# Patient Record
Sex: Female | Born: 1965
Health system: Southern US, Community
[De-identification: ages and names within clinical notes are randomized; demographics above are authoritative.]

## PROBLEM LIST (undated history)

## (undated) DIAGNOSIS — R768 Other specified abnormal immunological findings in serum: Secondary | ICD-10-CM

## (undated) DIAGNOSIS — L700 Acne vulgaris: Secondary | ICD-10-CM

## (undated) DIAGNOSIS — N200 Calculus of kidney: Secondary | ICD-10-CM

## (undated) DIAGNOSIS — R000 Tachycardia, unspecified: Secondary | ICD-10-CM

## (undated) DIAGNOSIS — N879 Dysplasia of cervix uteri, unspecified: Secondary | ICD-10-CM

## (undated) DIAGNOSIS — R7989 Other specified abnormal findings of blood chemistry: Secondary | ICD-10-CM

## (undated) HISTORY — DX: Acne vulgaris: L70.0

## (undated) HISTORY — PX: BREAST BIOPSY: SHX20

## (undated) HISTORY — DX: Tachycardia, unspecified: R00.0

## (undated) HISTORY — PX: OTHER SURGICAL HISTORY: SHX169

## (undated) HISTORY — DX: Dysplasia of cervix uteri, unspecified: N87.9

## (undated) HISTORY — DX: Other specified abnormal immunological findings in serum: R76.8

## (undated) HISTORY — DX: Other specified abnormal findings of blood chemistry: R79.89

## (undated) HISTORY — PX: TONSILLECTOMY: SUR1361

## (undated) HISTORY — DX: Calculus of kidney: N20.0

---

## 1994-06-26 HISTORY — PX: OTHER SURGICAL HISTORY: SHX169

## 1998-06-26 HISTORY — PX: OTHER SURGICAL HISTORY: SHX169

## 2000-03-15 ENCOUNTER — Other Ambulatory Visit: Admission: RE | Admit: 2000-03-15 | Discharge: 2000-03-15 | Payer: Self-pay | Admitting: Family Medicine

## 2000-04-13 ENCOUNTER — Ambulatory Visit (HOSPITAL_COMMUNITY): Admission: RE | Admit: 2000-04-13 | Discharge: 2000-04-13 | Payer: Self-pay | Admitting: Obstetrics and Gynecology

## 2000-04-13 ENCOUNTER — Encounter (INDEPENDENT_AMBULATORY_CARE_PROVIDER_SITE_OTHER): Payer: Self-pay | Admitting: Specialist

## 2000-05-25 ENCOUNTER — Other Ambulatory Visit: Admission: RE | Admit: 2000-05-25 | Discharge: 2000-05-25 | Payer: Self-pay | Admitting: Obstetrics and Gynecology

## 2000-08-30 ENCOUNTER — Other Ambulatory Visit: Admission: RE | Admit: 2000-08-30 | Discharge: 2000-08-30 | Payer: Self-pay | Admitting: Obstetrics and Gynecology

## 2001-07-02 ENCOUNTER — Other Ambulatory Visit: Admission: RE | Admit: 2001-07-02 | Discharge: 2001-07-02 | Payer: Self-pay | Admitting: Obstetrics and Gynecology

## 2001-09-11 ENCOUNTER — Encounter: Payer: Self-pay | Admitting: Emergency Medicine

## 2001-09-11 ENCOUNTER — Emergency Department (HOSPITAL_COMMUNITY): Admission: EM | Admit: 2001-09-11 | Discharge: 2001-09-11 | Payer: Self-pay | Admitting: Emergency Medicine

## 2001-09-18 ENCOUNTER — Ambulatory Visit (HOSPITAL_COMMUNITY): Admission: RE | Admit: 2001-09-18 | Discharge: 2001-09-18 | Payer: Self-pay | Admitting: Neurology

## 2001-09-24 ENCOUNTER — Ambulatory Visit (HOSPITAL_COMMUNITY): Admission: RE | Admit: 2001-09-24 | Discharge: 2001-09-24 | Payer: Self-pay | Admitting: Neurology

## 2001-10-11 ENCOUNTER — Ambulatory Visit (HOSPITAL_BASED_OUTPATIENT_CLINIC_OR_DEPARTMENT_OTHER): Admission: RE | Admit: 2001-10-11 | Discharge: 2001-10-11 | Payer: Self-pay | Admitting: Neurology

## 2003-06-09 ENCOUNTER — Encounter: Admission: RE | Admit: 2003-06-09 | Discharge: 2003-06-09 | Payer: Self-pay | Admitting: Family Medicine

## 2004-07-26 ENCOUNTER — Ambulatory Visit: Payer: Self-pay | Admitting: Internal Medicine

## 2004-08-09 ENCOUNTER — Ambulatory Visit: Payer: Self-pay

## 2004-09-21 ENCOUNTER — Ambulatory Visit: Payer: Self-pay | Admitting: Internal Medicine

## 2005-03-23 ENCOUNTER — Other Ambulatory Visit: Admission: RE | Admit: 2005-03-23 | Discharge: 2005-03-23 | Payer: Self-pay | Admitting: Obstetrics and Gynecology

## 2005-08-16 ENCOUNTER — Other Ambulatory Visit: Admission: RE | Admit: 2005-08-16 | Discharge: 2005-08-16 | Payer: Self-pay | Admitting: Obstetrics & Gynecology

## 2006-03-08 ENCOUNTER — Other Ambulatory Visit: Admission: RE | Admit: 2006-03-08 | Discharge: 2006-03-08 | Payer: Self-pay | Admitting: Obstetrics & Gynecology

## 2007-03-21 ENCOUNTER — Other Ambulatory Visit: Admission: RE | Admit: 2007-03-21 | Discharge: 2007-03-21 | Payer: Self-pay | Admitting: Obstetrics and Gynecology

## 2008-04-14 ENCOUNTER — Other Ambulatory Visit: Admission: RE | Admit: 2008-04-14 | Discharge: 2008-04-14 | Payer: Self-pay | Admitting: Family Medicine

## 2009-04-15 ENCOUNTER — Other Ambulatory Visit: Admission: RE | Admit: 2009-04-15 | Discharge: 2009-04-15 | Payer: Self-pay | Admitting: Family Medicine

## 2010-11-11 NOTE — Op Note (Signed)
[facility]  Patient:    Hayley Price, Hayley Price                        MRN: [id number] Proc. Date: 04/13/00 Adm. Date:  [id number] Attending:  Malon Kindle CC:         Dr. Thomasene Mohair   Operative Report  PREOPERATIVE DIAGNOSIS:  Severe cervical dysplasia by colposcopic-directed biopsies with negative endocervical curettage, low-grade squamous intraepithelial lesion by Papanicolaou smear.  POSTOPERATIVE DIAGNOSIS:  Severe cervical dysplasia by colposcopic-directed biopsies with negative endocervical curettage, low-grade squamous intraepithelial lesion by Papanicolaou smear.  OPERATION:  CO2 laser conization, dilatation and curettage.  SURGEON:  Malachi Pro. Ambrose Mantle, M.D.  ANESTHESIA:  General.  DESCRIPTION OF PROCEDURE:  The patient was brought to the operating room and placed under satisfactory general anesthesia.  Exam revealed the uterus to be posterior, normal size.  The adnexa were free of masses.  The cervix was prepped with 5% acetic acid, and I could see white epithelium on the anterior and posterior cervical lips at the squamocolumnar junction.  After prepping with the 5% acetic acid, I did outline a conization with the laser, and I injected the cervix with a dilute solution of Pitressin, using about 15 cc of a solution of 10 units of Pitressin and 120 cc of saline.  I then did the laser cone with the CO2 laser Superpulse at the maximum wattage of 13 watts. After I cut the top of the cone off, I cut the cervical specimen as 12 oclock.  There was no significant bleeding.  I then dilated the cervical canal, sounded the uterus, and did an endocervical followed by an endometrial curettage, producing minimal tissue from both sites.  All the tissue that was removed was preserved.  The cervix was then reapproximated with four sutures of 0 chromic catgut at 12, 6, 3, and 9 oclock.  The cervical canal was again sounded to ensure patency, and a  little bit of Monsels solution was placed in the cervical bed.  The patient seemed to tolerate the procedure well.  Blood loss was less than 5 cc.  She was returned to recovery in satisfactory condition. DD:  04/13/00 TD:  04/13/00 Job: [id number]

## 2011-06-15 ENCOUNTER — Other Ambulatory Visit: Payer: Self-pay | Admitting: Family Medicine

## 2011-06-15 ENCOUNTER — Other Ambulatory Visit (HOSPITAL_COMMUNITY)
Admission: RE | Admit: 2011-06-15 | Discharge: 2011-06-15 | Disposition: A | Discharge status: Home / Self Care | Payer: No Typology Code available for payment source | Source: Ambulatory Visit | Attending: Family Medicine | Admitting: Family Medicine

## 2011-06-15 DIAGNOSIS — Z124 Encounter for screening for malignant neoplasm of cervix: Secondary | ICD-10-CM | POA: Insufficient documentation

## 2012-03-04 ENCOUNTER — Ambulatory Visit
Admission: RE | Admit: 2012-03-04 | Discharge: 2012-03-04 | Disposition: A | Discharge status: Home / Self Care | Payer: BC Managed Care – PPO | Source: Ambulatory Visit | Attending: Orthopedic Surgery | Admitting: Orthopedic Surgery

## 2012-03-04 ENCOUNTER — Other Ambulatory Visit: Payer: Self-pay | Admitting: Orthopedic Surgery

## 2012-03-04 DIAGNOSIS — T1490XA Injury, unspecified, initial encounter: Secondary | ICD-10-CM

## 2012-12-13 ENCOUNTER — Other Ambulatory Visit: Payer: Self-pay

## 2012-12-13 DIAGNOSIS — Z1231 Encounter for screening mammogram for malignant neoplasm of breast: Secondary | ICD-10-CM

## 2013-01-16 ENCOUNTER — Ambulatory Visit
Admission: RE | Admit: 2013-01-16 | Discharge: 2013-01-16 | Disposition: A | Discharge status: Home / Self Care | Payer: BC Managed Care – PPO | Source: Ambulatory Visit

## 2013-01-16 ENCOUNTER — Other Ambulatory Visit (HOSPITAL_COMMUNITY)
Admission: RE | Admit: 2013-01-16 | Discharge: 2013-01-16 | Disposition: A | Discharge status: Home / Self Care | Payer: BC Managed Care – PPO | Source: Ambulatory Visit | Attending: Family Medicine | Admitting: Family Medicine

## 2013-01-16 ENCOUNTER — Other Ambulatory Visit: Payer: Self-pay | Admitting: Family Medicine

## 2013-01-16 DIAGNOSIS — Z1151 Encounter for screening for human papillomavirus (HPV): Secondary | ICD-10-CM | POA: Insufficient documentation

## 2013-01-16 DIAGNOSIS — Z124 Encounter for screening for malignant neoplasm of cervix: Secondary | ICD-10-CM | POA: Insufficient documentation

## 2013-01-16 DIAGNOSIS — Z1231 Encounter for screening mammogram for malignant neoplasm of breast: Secondary | ICD-10-CM

## 2013-01-16 DIAGNOSIS — Z113 Encounter for screening for infections with a predominantly sexual mode of transmission: Secondary | ICD-10-CM | POA: Insufficient documentation

## 2013-01-20 ENCOUNTER — Other Ambulatory Visit: Payer: Self-pay | Admitting: Family Medicine

## 2013-01-20 DIAGNOSIS — R928 Other abnormal and inconclusive findings on diagnostic imaging of breast: Secondary | ICD-10-CM

## 2013-02-06 ENCOUNTER — Ambulatory Visit
Admission: RE | Admit: 2013-02-06 | Discharge: 2013-02-06 | Disposition: A | Discharge status: Home / Self Care | Payer: BC Managed Care – PPO | Source: Ambulatory Visit | Attending: Family Medicine | Admitting: Family Medicine

## 2013-02-06 DIAGNOSIS — R928 Other abnormal and inconclusive findings on diagnostic imaging of breast: Secondary | ICD-10-CM

## 2014-05-08 ENCOUNTER — Other Ambulatory Visit: Payer: Self-pay | Admitting: Family Medicine

## 2014-05-08 ENCOUNTER — Ambulatory Visit
Admission: RE | Admit: 2014-05-08 | Discharge: 2014-05-08 | Disposition: A | Discharge status: Home / Self Care | Payer: BC Managed Care – PPO | Source: Ambulatory Visit

## 2014-05-08 ENCOUNTER — Other Ambulatory Visit: Payer: Self-pay

## 2014-05-08 ENCOUNTER — Other Ambulatory Visit (HOSPITAL_COMMUNITY)
Admission: RE | Admit: 2014-05-08 | Discharge: 2014-05-08 | Disposition: A | Discharge status: Home / Self Care | Payer: BC Managed Care – PPO | Source: Ambulatory Visit | Attending: Family Medicine | Admitting: Family Medicine

## 2014-05-08 DIAGNOSIS — Z113 Encounter for screening for infections with a predominantly sexual mode of transmission: Secondary | ICD-10-CM | POA: Diagnosis present

## 2014-05-08 DIAGNOSIS — Z01419 Encounter for gynecological examination (general) (routine) without abnormal findings: Secondary | ICD-10-CM | POA: Diagnosis present

## 2014-05-08 DIAGNOSIS — Z1231 Encounter for screening mammogram for malignant neoplasm of breast: Secondary | ICD-10-CM

## 2014-05-08 DIAGNOSIS — N760 Acute vaginitis: Secondary | ICD-10-CM | POA: Diagnosis present

## 2014-05-08 DIAGNOSIS — Z124 Encounter for screening for malignant neoplasm of cervix: Secondary | ICD-10-CM | POA: Diagnosis not present

## 2014-05-12 LAB — CYTOLOGY - PAP

## 2015-04-27 ENCOUNTER — Other Ambulatory Visit: Payer: Self-pay

## 2015-04-27 DIAGNOSIS — Z1231 Encounter for screening mammogram for malignant neoplasm of breast: Secondary | ICD-10-CM

## 2015-04-28 ENCOUNTER — Other Ambulatory Visit: Payer: Self-pay | Admitting: Family Medicine

## 2015-04-28 ENCOUNTER — Other Ambulatory Visit (HOSPITAL_COMMUNITY)
Admission: RE | Admit: 2015-04-28 | Discharge: 2015-04-28 | Disposition: A | Discharge status: Home / Self Care | Payer: Managed Care, Other (non HMO) | Source: Ambulatory Visit | Attending: Family Medicine | Admitting: Family Medicine

## 2015-04-28 DIAGNOSIS — Z124 Encounter for screening for malignant neoplasm of cervix: Secondary | ICD-10-CM | POA: Diagnosis present

## 2015-04-28 DIAGNOSIS — Z1151 Encounter for screening for human papillomavirus (HPV): Secondary | ICD-10-CM | POA: Insufficient documentation

## 2015-04-30 LAB — CYTOLOGY - PAP

## 2015-06-02 ENCOUNTER — Ambulatory Visit
Admission: RE | Admit: 2015-06-02 | Discharge: 2015-06-02 | Disposition: A | Discharge status: Home / Self Care | Payer: Managed Care, Other (non HMO) | Source: Ambulatory Visit

## 2015-06-02 DIAGNOSIS — Z1231 Encounter for screening mammogram for malignant neoplasm of breast: Secondary | ICD-10-CM

## 2016-10-11 ENCOUNTER — Other Ambulatory Visit (HOSPITAL_COMMUNITY)
Admission: RE | Admit: 2016-10-11 | Discharge: 2016-10-11 | Disposition: A | Discharge status: Home / Self Care | Payer: BLUE CROSS/BLUE SHIELD | Source: Ambulatory Visit | Attending: Family Medicine | Admitting: Family Medicine

## 2016-10-11 ENCOUNTER — Other Ambulatory Visit: Payer: Self-pay | Admitting: Family Medicine

## 2016-10-11 DIAGNOSIS — Z124 Encounter for screening for malignant neoplasm of cervix: Secondary | ICD-10-CM | POA: Diagnosis present

## 2016-10-11 DIAGNOSIS — Z1151 Encounter for screening for human papillomavirus (HPV): Secondary | ICD-10-CM | POA: Insufficient documentation

## 2016-10-12 LAB — CYTOLOGY - PAP
Diagnosis: NEGATIVE
HPV: NOT DETECTED

## 2017-06-26 HISTORY — PX: BREAST BIOPSY: SHX20

## 2017-12-26 ENCOUNTER — Other Ambulatory Visit: Payer: Self-pay | Admitting: Family Medicine

## 2017-12-26 ENCOUNTER — Other Ambulatory Visit (HOSPITAL_COMMUNITY)
Admission: RE | Admit: 2017-12-26 | Discharge: 2017-12-26 | Disposition: A | Discharge status: Home / Self Care | Payer: BLUE CROSS/BLUE SHIELD | Source: Ambulatory Visit | Attending: Family Medicine | Admitting: Family Medicine

## 2017-12-26 DIAGNOSIS — Z124 Encounter for screening for malignant neoplasm of cervix: Secondary | ICD-10-CM | POA: Insufficient documentation

## 2017-12-26 DIAGNOSIS — Z1231 Encounter for screening mammogram for malignant neoplasm of breast: Secondary | ICD-10-CM

## 2017-12-31 LAB — CYTOLOGY - PAP
Adequacy: ABSENT
Diagnosis: NEGATIVE
HPV: NOT DETECTED

## 2018-01-21 ENCOUNTER — Ambulatory Visit
Admission: RE | Admit: 2018-01-21 | Discharge: 2018-01-21 | Disposition: A | Discharge status: Home / Self Care | Payer: Self-pay | Source: Ambulatory Visit | Attending: Family Medicine | Admitting: Family Medicine

## 2018-01-21 DIAGNOSIS — Z1231 Encounter for screening mammogram for malignant neoplasm of breast: Secondary | ICD-10-CM

## 2018-01-22 ENCOUNTER — Other Ambulatory Visit: Payer: Self-pay | Admitting: Family Medicine

## 2018-01-22 DIAGNOSIS — R921 Mammographic calcification found on diagnostic imaging of breast: Secondary | ICD-10-CM

## 2018-01-29 ENCOUNTER — Other Ambulatory Visit: Payer: Self-pay | Admitting: Family Medicine

## 2018-01-29 ENCOUNTER — Ambulatory Visit
Admission: RE | Admit: 2018-01-29 | Discharge: 2018-01-29 | Disposition: A | Discharge status: Home / Self Care | Payer: BLUE CROSS/BLUE SHIELD | Source: Ambulatory Visit | Attending: Family Medicine | Admitting: Family Medicine

## 2018-01-29 DIAGNOSIS — R921 Mammographic calcification found on diagnostic imaging of breast: Secondary | ICD-10-CM

## 2018-01-30 ENCOUNTER — Ambulatory Visit
Admission: RE | Admit: 2018-01-30 | Discharge: 2018-01-30 | Disposition: A | Discharge status: Home / Self Care | Payer: BLUE CROSS/BLUE SHIELD | Source: Ambulatory Visit | Attending: Family Medicine | Admitting: Family Medicine

## 2018-01-30 DIAGNOSIS — R921 Mammographic calcification found on diagnostic imaging of breast: Secondary | ICD-10-CM

## 2018-12-16 ENCOUNTER — Other Ambulatory Visit: Payer: Self-pay | Admitting: Family Medicine

## 2018-12-16 DIAGNOSIS — Z1231 Encounter for screening mammogram for malignant neoplasm of breast: Secondary | ICD-10-CM

## 2019-01-23 ENCOUNTER — Other Ambulatory Visit: Payer: Self-pay

## 2019-01-23 ENCOUNTER — Ambulatory Visit
Admission: RE | Admit: 2019-01-23 | Discharge: 2019-01-23 | Disposition: A | Discharge status: Home / Self Care | Payer: BC Managed Care – PPO | Source: Ambulatory Visit | Attending: Family Medicine | Admitting: Family Medicine

## 2019-01-23 DIAGNOSIS — Z1231 Encounter for screening mammogram for malignant neoplasm of breast: Secondary | ICD-10-CM

## 2020-01-20 ENCOUNTER — Other Ambulatory Visit: Payer: Self-pay | Admitting: Family Medicine

## 2020-01-20 DIAGNOSIS — Z1231 Encounter for screening mammogram for malignant neoplasm of breast: Secondary | ICD-10-CM

## 2020-01-29 ENCOUNTER — Other Ambulatory Visit: Payer: Self-pay

## 2020-01-29 ENCOUNTER — Ambulatory Visit
Admission: RE | Admit: 2020-01-29 | Discharge: 2020-01-29 | Disposition: A | Discharge status: Home / Self Care | Payer: BLUE CROSS/BLUE SHIELD | Source: Ambulatory Visit | Attending: Family Medicine | Admitting: Family Medicine

## 2020-01-29 DIAGNOSIS — Z1231 Encounter for screening mammogram for malignant neoplasm of breast: Secondary | ICD-10-CM

## 2020-04-19 NOTE — Progress Notes (Signed)
[facility]    Provider:  Dr Jaynee Eagles Requesting Provider: Marda Stalker, PA-C Primary Care Provider:  Marda Stalker, PA-C  CC:  Tingling in hands  HPI:  Hayley Price is a 54 y.o. female here as requested by Marda Stalker, PA-C for cardiac arrhythmia, sleep disorder, vitamin D deficiency, HSV.  I reviewed notes from Marda Stalker, ongoing tingling in the hands, patient had a reaction after second Covid shot, she had it in April, was very painful in her arm, 4 hours after receiving the Covid vaccine she began having numbness from her bilateral knees down to the foot, tingling is mostly lateral calf to the top of the foot, next day knee pain going downstairs, then developed widespread joint disease.  She finished her prednisone that [facility] prescribed, was getting better but after she finished the meds experience the same symptoms, felt like she had an overnight attack of arthritis, joint pain, 6 weeks post vaccine, they also tried meloxicam, and rheumatology was referred, she saw a rheumatologist who could not do anything and requested coming to neurology, however Marda Stalker stated that it did not appear to be nerve related, more swelling and joint aches, rheumatology did a full panel of labs for inflammatory arthritis or possible post viral, and the rheumatologist told her it was not rheumatology related.  Then patient stated that there was some numbness and they referred her to neurology.  She says "I am falling apart". Symptoms started 5-6 hour after the second shot. 6 months still having symptoms. She can't do a lot of things, she has been on 2 rounds of steroids and been off of them for 2 months. Sh states it started with numbness and tingling left leg lateral to the top of the foot. It is still numb but not as much. No back, no radiation into the lags. It has gotten better. No weakness. The next morning she went to walk down the stairs because she  couldn;t bend her knees, lots of joint pain and swelling followed in the knees and to the ankles and she still can;t bend her ankles, her feet and ankles make a sound, pain and stiffness. Difficulty bending her joints, still stiff. She improved with prednisone.She saw Rheumatology who said everything was normal and this is not a Rheumatology disorder. She has also had other symptoms, she had her period for 16 days and cramps (I encouraged her to talk to Marda Stalker about this). No weakness. No paresthesias in the hands.Feels losing fine motor in the hands, impaired coordination in both hands. Unable to use her hands.No balance issues, no falls.    Reviewed notes, labs and imaging from outside physicians, which showed:  Rheumatology tests([place] [facility]: RA, CCP, Sed Rate, CRPQ, CMP,CBCD,Lyme I don;t have results but patient states she was told its normal)   Labs included [facility] July 2019 which was normal with BUN 11 and creatinine 0.64.  CT head 2003: after vertigo and "seizure" IMPRESSION  NEGATIVE NONCONTRAST CRANIAL CT.  Review of Systems: Patient complains of symptoms per HPI as well as the following symptoms" numbness, tingling, weakness, joint pain. Pertinent negatives and positives per HPI. All others negative.   Social History   Socioeconomic History  . Marital status: Divorced    Spouse name: Not on file  . Number of children: 1  . Years of education: Not on file  . Highest education level: Bachelor's degree (e.g., BA, AB, BS)  Occupational History  . Not on file  Tobacco Use  .  Smoking status: Never Smoker  . Smokeless tobacco: Never Used  Vaping Use  . Vaping Use: Never used  Substance and Sexual Activity  . Alcohol use: Yes    Alcohol/week: 1.0 - 2.0 standard drink    Types: 1 - 2 Glasses of wine per week  . Drug use: Never  . Sexual activity: Not on file  Other Topics Concern  . Not on file  Social History Narrative   Lives at home alone   Caffeine: 2  cups coffee/day   Social Determinants of Health   Financial Resource Strain:   . Difficulty of Paying Living Expenses: Not on file  Food Insecurity:   . Worried About Charity fundraiser in the Last Year: Not on file  . Ran Out of Food in the Last Year: Not on file  Transportation Needs:   . Lack of Transportation (Medical): Not on file  . Lack of Transportation (Non-Medical): Not on file  Physical Activity:   . Days of Exercise per Week: Not on file  . Minutes of Exercise per Session: Not on file  Stress:   . Feeling of Stress : Not on file  Social Connections:   . Frequency of Communication with Friends and Family: Not on file  . Frequency of Social Gatherings with Friends and Family: Not on file  . Attends Religious Services: Not on file  . Active Member of Clubs or Organizations: Not on file  . Attends Archivist Meetings: Not on file  . Marital Status: Not on file  Intimate Partner Violence:   . Fear of Current or Ex-Partner: Not on file  . Emotionally Abused: Not on file  . Physically Abused: Not on file  . Sexually Abused: Not on file    Family History  Problem Relation Age of Onset  . Osteoporosis Mother   . Pancreatic cancer Father   . Healthy Brother   . Osteoporosis Maternal Grandmother   . Neuropathy Neg Hx     Past Medical History:  Diagnosis Date  . Cervical dysplasia    per notes from [facility]  . Cystic acne    per notes from [facility]  . HSV-2 seropositive    no confirmed active outbreaks; per notes from [facility]  . Low vitamin D level    per notes from [facility]  . Nephrolithiasis    per notes from [facility]  . Tachycardia    per notes from [place]    Patient Active Problem List   Diagnosis Date Noted  . Adverse reaction to COVID-19 vaccine 04/20/2020  . Polyarthralgia 04/20/2020  . Peroneal neuropathy, left 04/20/2020  . Multiple neurological symptoms 04/20/2020  . Numbness and  tingling in both hands 04/20/2020    Past Surgical History:  Procedure Laterality Date  . BREAST BIOPSY Left 2019  . BREAST BIOPSY Left    per notes from [facility]  . cervical conization  2000   per notes from [facility]; laser  . COLONOSCOPY     per notes from [facility] negative, rpt 10 years  . TONSILLECTOMY     per notes from [facility]  . vulvar reconstruction  1996   per notes from [place]    Current Outpatient Medications  Medication Sig Dispense Refill  . amitriptyline (ELAVIL) 10 MG tablet Take 10 mg by mouth at bedtime.    Marland Kitchen ascorbic acid (VITAMIN C) 500 MG tablet Take 1,000 mg by mouth daily.    Marland Kitchen  CALCIUM PO Take by mouth.    Marland Kitchen ibuprofen (ADVIL) 200 MG tablet Take 800 mg by mouth in the morning, at noon, and at bedtime.    Marland Kitchen MAGNESIUM CITRATE PO Take by mouth.    . Multiple Vitamin (MULTIVITAMIN PO) Take by mouth.    Marland Kitchen VITAMIN D PO Take by mouth.     No current facility-administered medications for this visit.    Allergies as of 04/20/2020 - Review Complete 04/20/2020  Allergen Reaction Noted  . Amitriptyline  04/20/2020  . Bactrim [sulfamethoxazole-trimethoprim]  04/20/2020  . Ciprofloxacin  04/20/2020  . Other  04/20/2020  . Poison ivy extract  04/20/2020  . Vioxx [rofecoxib]  04/20/2020  . Latex Rash 04/20/2020    Vitals: BP (!) 145/84 (BP Location: Right Arm, Patient Position: Sitting)   Pulse 93   Ht '5\' 4"'  (1.626 m)   Wt 148 lb (67.1 kg)   BMI 25.40 kg/m  Last Weight:  Wt Readings from Last 1 Encounters:  04/20/20 148 lb (67.1 kg)   Last Height:   Ht Readings from Last 1 Encounters:  04/20/20 '5\' 4"'  (1.626 m)     Physical exam: Exam: Gen: NAD, anxious and slightly pressured speech, conversant, well nourised, obese, well groomed                     CV: RRR, no MRG. No Carotid Bruits. No peripheral edema, warm, nontender Eyes: Conjunctivae clear without exudates or hemorrhage  Neuro: Detailed Neurologic  Exam  Speech:    Speech is normal; fluent and spontaneous with normal comprehension.  Cognition:    The patient is oriented to person, place, and time;     recent and remote memory intact;     language fluent;     normal attention, concentration,     fund of knowledge Cranial Nerves:    The pupils are equal, round, and reactive to light. The fundi are flat . Visual fields are full to finger confrontation. Extraocular movements are intact. Trigeminal sensation is intact and the muscles of mastication are normal. The face is symmetric. The palate elevates in the midline. Hearing intact. Voice is normal. Shoulder shrug is normal. The tongue has normal motion without fasciculations.   Coordination:    No dysmetria or ataxia noted(but patient reports these symptoms)  Gait:    Normal native gait  Motor Observation:    No asymmetry, no atrophy, and no involuntary movements noted. Tone:    Normal muscle tone.    Posture:    Posture is normal. normal erect    Strength: Weak grip. Strength is V/V in the upper and lower limbs.      Sensation: intact to LT     Reflex Exam:  DTR's:    Deep tendon reflexes in the upper and lower extremities are brisk bilaterally.   Toes:    The toes are downgoing bilaterally.   Clonus:    Clonus is absent.    Assessment/Plan:  Patient with with multiple neurologic symptoms, after Covid, mostly pain and stiffness in knees, ankles, joints of hands, elbows after covid shot. Difficulty bending her joints. She saw Rheumatology. She reports multiple symptoms including numbness/tingling in the left lower leg in a peroneal distribution and in the hands.  Improved with steroids so need to make sure she did not have any inflammatory/demyelinating events in the brain or cervical spine such as Multiple Sclerosis or Transverse myelitis due to her multiple complaints including fine motor decline in the  hands and exam with brisk reflexes which may be abnormal for her  age.   EMG/NCS: Bilateral uppers for CTS, Ulnar neuropathy or other. Also left leg peroneal sensory recording at the Tibialis Anterior (she had a crush injury to the left foot so recording at the EDB may be invalid due to prior injury).  Orders Placed This Encounter  Procedures  . MR BRAIN W WO CONTRAST  . MR CERVICAL SPINE W WO CONTRAST  . Vitamin B1  . Hemoglobin A1c  . Methylmalonic acid, serum  . TSH  . Sedimentation rate  . B12 and Folate Panel  . Heavy metals, blood  . Vitamin B6  . Multiple Myeloma Panel (SPEP&IFE w/QIG)  . Basic Metabolic Panel     Cc: Marda Stalker, PA-C,    Sarina Ill, MD  [facility] [address] [place], [place] [address]  Phone [phone] Fax [phone]

## 2020-04-20 ENCOUNTER — Ambulatory Visit (INDEPENDENT_AMBULATORY_CARE_PROVIDER_SITE_OTHER): Payer: BLUE CROSS/BLUE SHIELD | Admitting: Neurology

## 2020-04-20 ENCOUNTER — Encounter: Payer: Self-pay | Admitting: Neurology

## 2020-04-20 ENCOUNTER — Other Ambulatory Visit: Payer: Self-pay

## 2020-04-20 VITALS — BP 145/84 | HR 93 | Ht 64.0 in | Wt 148.0 lb

## 2020-04-20 DIAGNOSIS — M6281 Muscle weakness (generalized): Secondary | ICD-10-CM

## 2020-04-20 DIAGNOSIS — R29898 Other symptoms and signs involving the musculoskeletal system: Secondary | ICD-10-CM

## 2020-04-20 DIAGNOSIS — R292 Abnormal reflex: Secondary | ICD-10-CM

## 2020-04-20 DIAGNOSIS — M2550 Pain in unspecified joint: Secondary | ICD-10-CM | POA: Diagnosis not present

## 2020-04-20 DIAGNOSIS — R202 Paresthesia of skin: Secondary | ICD-10-CM

## 2020-04-20 DIAGNOSIS — G589 Mononeuropathy, unspecified: Secondary | ICD-10-CM

## 2020-04-20 DIAGNOSIS — R2990 Unspecified symptoms and signs involving the nervous system: Secondary | ICD-10-CM

## 2020-04-20 DIAGNOSIS — G5732 Lesion of lateral popliteal nerve, left lower limb: Secondary | ICD-10-CM | POA: Diagnosis not present

## 2020-04-20 DIAGNOSIS — R270 Ataxia, unspecified: Secondary | ICD-10-CM

## 2020-04-20 DIAGNOSIS — R200 Anesthesia of skin: Secondary | ICD-10-CM | POA: Diagnosis not present

## 2020-04-20 DIAGNOSIS — T50B95A Adverse effect of other viral vaccines, initial encounter: Secondary | ICD-10-CM | POA: Insufficient documentation

## 2020-04-20 DIAGNOSIS — R29818 Other symptoms and signs involving the nervous system: Secondary | ICD-10-CM

## 2020-04-20 NOTE — Patient Instructions (Signed)
MRI of the brain and cervical spine EMG/NCS Blood work   Marine scientist is a test to check how well your muscles and nerves are working. This procedure includes the combined use of electromyogram (EMG) and nerve conduction study (NCS). EMG is used to look for muscular disorders. NCS, which is also called electroneurogram, measures how well your nerves are controlling your muscles. The procedures are usually done together to check if your muscles and nerves are healthy. If the results of the tests are abnormal, this may indicate disease or injury, such as a neuromuscular disease or peripheral nerve damage. Tell a health care provider about:  Any allergies you have.  All medicines you are taking, including vitamins, herbs, eye drops, creams, and over-the-counter medicines.  Any problems you or family members have had with anesthetic medicines.  Any blood disorders you have.  Any surgeries you have had.  Any medical conditions you have.  If you have a pacemaker.  Whether you are pregnant or may be pregnant. What are the risks? Generally, this is a safe procedure. However, problems may occur, including:  Infection where the electrodes were inserted.  Bleeding. What happens before the procedure? Medicines Ask your health care provider about:  Changing or stopping your regular medicines. This is especially important if you are taking diabetes medicines or blood thinners.  Taking medicines such as aspirin and ibuprofen. These medicines can thin your blood. Do not take these medicines unless your health care provider tells you to take them.  Taking over-the-counter medicines, vitamins, herbs, and supplements. General instructions  Your health care provider may ask you to avoid: ? Beverages that have caffeine, such as coffee and tea. ? Any products that contain nicotine or tobacco. These products include cigarettes, e-cigarettes, and chewing tobacco. If you  need help quitting, ask your health care provider.  Do not use lotions or creams on the same day that you will be having the procedure. What happens during the procedure? For EMG   Your health care provider will ask you to stay in a position so that he or she can access the muscle that will be studied. You may be standing, sitting, or lying down.  You may be given a medicine that numbs the area (local anesthetic).  A very thin needle that has an electrode will be inserted into your muscle.  Another small electrode will be placed on your skin near the muscle.  Your health care provider will ask you to continue to remain still.  The electrodes will send a signal that tells about the electrical activity of your muscles. You may see this on a monitor or hear it in the room.  After your muscles have been studied at rest, your health care provider will ask you to contract or flex your muscles. The electrodes will send a signal that tells about the electrical activity of your muscles.  Your health care provider will remove the electrodes and the electrode needles when the procedure is finished. The procedure may vary among health care providers and hospitals. For NCS   An electrode that records your nerve activity (recording electrode) will be placed on your skin by the muscle that is being studied.  An electrode that is used as a reference (reference electrode) will be placed near the recording electrode.  A paste or gel will be applied to your skin between the recording electrode and the reference electrode.  Your nerve will be stimulated with a mild shock. Your health care provider  will measure how much time it takes for your muscle to react.  Your health care provider will remove the electrodes and the gel when the procedure is finished. The procedure may vary among health care providers and hospitals. What happens after the procedure?  It is up to you to get the results of your  procedure. Ask your health care provider, or the department that is doing the procedure, when your results will be ready.  Your health care provider may: ? Give you medicines for any pain. ? Monitor the insertion sites to make sure that bleeding stops. Summary  Electromyoneurogram is a test to check how well your muscles and nerves are working.  If the results of the tests are abnormal, this may indicate disease or injury.  This is a safe procedure. However, problems may occur, such as bleeding and infection.  Your health care provider will do two tests to complete this procedure. One checks your muscles (EMG) and another checks your nerves (NCS).  It is up to you to get the results of your procedure. Ask your health care provider, or the department that is doing the procedure, when your results will be ready. This information is not intended to replace advice given to you by your health care provider. Make sure you discuss any questions you have with your health care provider. Document Revised: 02/26/2018 Document Reviewed: 02/08/2018 Elsevier Patient Education  [place].

## 2020-04-21 ENCOUNTER — Telehealth: Payer: Self-pay | Admitting: Neurology

## 2020-04-21 NOTE — Telephone Encounter (Signed)
[employer] Auth: [employer] spoke to Hayley Price Ref # [id number] order sent to GI. They will reach out to the patient to schedule.

## 2020-04-22 ENCOUNTER — Telehealth: Payer: Self-pay | Admitting: *Deleted

## 2020-04-22 NOTE — Telephone Encounter (Addendum)
Spoke with patient and informed  Her the blood work so far is normal, still a few pending and we will let her know if any come back abnormal. She asked about pending labs, I advised she can sign up my chart. I sent e mail to her. She  verbalized understanding, appreciation.

## 2020-04-27 LAB — METHYLMALONIC ACID, SERUM: Methylmalonic Acid: 117 nmol/L (ref 0–378)

## 2020-04-27 LAB — HEMOGLOBIN A1C
Est. average glucose Bld gHb Est-mCnc: 105 mg/dL
Hgb A1c MFr Bld: 5.3 % (ref 4.8–5.6)

## 2020-04-27 LAB — BASIC METABOLIC PANEL
BUN/Creatinine Ratio: 18 (ref 9–23)
BUN: 13 mg/dL (ref 6–24)
CO2: 23 mmol/L (ref 20–29)
Calcium: 9.7 mg/dL (ref 8.7–10.2)
Chloride: 102 mmol/L (ref 96–106)
Creatinine, Ser: 0.72 mg/dL (ref 0.57–1.00)
GFR calc Af Amer: 110 mL/min/{1.73_m2} (ref >59)
GFR calc non Af Amer: 95 mL/min/{1.73_m2} (ref >59)
Glucose: 108 mg/dL — ABNORMAL HIGH (ref 65–99)
Potassium: 4.5 mmol/L (ref 3.5–5.2)
Sodium: 139 mmol/L (ref 134–144)

## 2020-04-27 LAB — MULTIPLE MYELOMA PANEL, SERUM
Albumin SerPl Elph-Mcnc: 4 g/dL (ref 2.9–4.4)
Albumin/Glob SerPl: 1.4 (ref 0.7–1.7)
Alpha 1: 0.2 g/dL (ref 0.0–0.4)
Alpha2 Glob SerPl Elph-Mcnc: 0.6 g/dL (ref 0.4–1.0)
B-Globulin SerPl Elph-Mcnc: 1.1 g/dL (ref 0.7–1.3)
Gamma Glob SerPl Elph-Mcnc: 1.1 g/dL (ref 0.4–1.8)
Globulin, Total: 3 g/dL (ref 2.2–3.9)
IgA/Immunoglobulin A, Serum: 172 mg/dL (ref 87–352)
IgG (Immunoglobin G), Serum: 948 mg/dL (ref 586–1602)
IgM (Immunoglobulin M), Srm: 258 mg/dL — ABNORMAL HIGH (ref 26–217)
Total Protein: 7 g/dL (ref 6.0–8.5)

## 2020-04-27 LAB — TSH: TSH: 1.6 u[IU]/mL (ref 0.450–4.500)

## 2020-04-27 LAB — SEDIMENTATION RATE: Sed Rate: 2 mm/hr (ref 0–40)

## 2020-04-27 LAB — HEAVY METALS, BLOOD
Arsenic: 3 ug/L (ref 2–23)
Lead, Blood: <1 ug/dL (ref 0–4)
Mercury: <1 ug/L (ref 0.0–14.9)

## 2020-04-27 LAB — VITAMIN B1: Thiamine: 154.8 nmol/L (ref 66.5–200.0)

## 2020-04-27 LAB — B12 AND FOLATE PANEL
Folate: 17.4 ng/mL (ref >3.0)
Vitamin B-12: 605 pg/mL (ref 232–1245)

## 2020-04-27 LAB — VITAMIN B6: Vitamin B6: 28.3 ug/L (ref 2.0–32.8)

## 2020-05-08 ENCOUNTER — Ambulatory Visit
Admission: RE | Admit: 2020-05-08 | Discharge: 2020-05-08 | Disposition: A | Discharge status: Home / Self Care | Payer: BLUE CROSS/BLUE SHIELD | Source: Ambulatory Visit | Attending: Neurology | Admitting: Neurology

## 2020-05-08 ENCOUNTER — Other Ambulatory Visit: Payer: Self-pay

## 2020-05-08 DIAGNOSIS — R202 Paresthesia of skin: Secondary | ICD-10-CM | POA: Diagnosis not present

## 2020-05-08 DIAGNOSIS — R200 Anesthesia of skin: Secondary | ICD-10-CM | POA: Diagnosis not present

## 2020-05-08 MED ORDER — GADOBENATE DIMEGLUMINE 529 MG/ML IV SOLN
13.0000 mL | Freq: Once | INTRAVENOUS | Status: AC | PRN
  Administered 2020-05-08: 13 mL via INTRAVENOUS

## 2020-05-10 ENCOUNTER — Ambulatory Visit (INDEPENDENT_AMBULATORY_CARE_PROVIDER_SITE_OTHER): Payer: BLUE CROSS/BLUE SHIELD | Admitting: Neurology

## 2020-05-10 DIAGNOSIS — R2990 Unspecified symptoms and signs involving the nervous system: Secondary | ICD-10-CM

## 2020-05-10 DIAGNOSIS — Z0289 Encounter for other administrative examinations: Secondary | ICD-10-CM

## 2020-05-10 DIAGNOSIS — R200 Anesthesia of skin: Secondary | ICD-10-CM

## 2020-05-10 DIAGNOSIS — R29898 Other symptoms and signs involving the musculoskeletal system: Secondary | ICD-10-CM

## 2020-05-10 DIAGNOSIS — G629 Polyneuropathy, unspecified: Secondary | ICD-10-CM

## 2020-05-10 DIAGNOSIS — M6281 Muscle weakness (generalized): Secondary | ICD-10-CM

## 2020-05-10 DIAGNOSIS — R202 Paresthesia of skin: Secondary | ICD-10-CM | POA: Diagnosis not present

## 2020-05-10 NOTE — Progress Notes (Signed)
Full Name: Hayley Price Gender: Female MRN #: [id number] Date of Birth: 08-Jul-1965    Visit Date: 05/10/2020 13:27 Age: 54 Years Examining Physician: Naomie Dean, MD  Referring Physician: Jarrett Soho, PA-C Height: 5 feet 4 inch Patient History: 148lbs    History: Multiple neurologic symptoms after Covid infection including numbness/tingling in the legs and arms. MRI of the brain and cervical spine did not show any etiology for her symptoms (see report in Epic). Blood work normal (see Epic)  Summary: EMG/NCS of the bilateral upper extremities and left lower extremity. All nerves and muscles (as indicated in the following tables) were within normal limits.  Conclusion: This is a normal study. No evidence for mononeuropathy, large-fiber polyneuropathy, radiculopathy or muscle disorder.   Naomie Dean, M.D.  [facility] [address] [place], [place] [address] Tel: [phone] Fax: [phone]  Verbal informed consent was obtained from the patient, patient was informed of potential risk of procedure, including bruising, bleeding, hematoma formation, infection, muscle weakness, muscle pain, numbness, among others.        MNC    Nerve / Sites Muscle Latency Ref. Amplitude Ref. Rel Amp Segments Distance Velocity Ref. Area    ms ms mV mV %  cm m/s m/s mVms  R Median - APB     Wrist APB 3.7 ?4.4 8.0 ?4.0 100 Wrist - APB 7   35.9     Upper arm APB 7.2  7.5  94 Upper arm - Wrist 20 58 ?49 34.0  L Median - APB     Wrist APB 3.6 ?4.4 8.1 ?4.0 100 Wrist - APB 7   36.0     Upper arm APB 7.1  7.4  90.9 Upper arm - Wrist 20 57 ?49 33.9  R Ulnar - ADM     Wrist ADM 3.3 ?3.3 11.3 ?6.0 100 Wrist - ADM 7   41.9     B.Elbow ADM 6.1  10.2  89.6 B.Elbow - Wrist 19 68 ?49 40.5     A.Elbow ADM 7.6  9.9  97.7 A.Elbow - B.Elbow 10 64 ?49 39.7  L Ulnar - ADM     Wrist ADM 3.3 ?3.3 10.5 ?6.0 100 Wrist - ADM 7   38.2     B.Elbow ADM 6.2  9.8  93.7 B.Elbow  - Wrist 18 61 ?49 36.3     A.Elbow ADM 7.9  9.5  97.2 A.Elbow - B.Elbow 10 61 ?49 36.4  L Peroneal - EDB     Ankle EDB 4.0 ?6.5 4.4 ?2.0 100 Ankle - EDB 9   21.4     Fib head EDB 9.7  4.2  94.5 Fib head - Ankle 28 49 ?44 16.5     Pop fossa EDB 11.8  4.1  98.7 Pop fossa - Fib head 10 48 ?44 16.4         Pop fossa - Ankle      L Tibial - AH     Ankle AH 4.4 ?5.8 9.7 ?4.0 100 Ankle - AH 9   33.4     Pop fossa AH 12.2  8.6  88.6 Pop fossa - Ankle 33 42 ?41 31.6                  SNC    Nerve / Sites Rec. Site Peak Lat Ref.  Amp Ref. Segments Distance Peak Diff Ref.    ms ms V V  cm ms ms  L  Sural - Ankle (Calf)     Calf Ankle 3.8 ?4.4 13 ?6 Calf - Ankle 14    L Superficial peroneal - Ankle     Lat leg Ankle 3.8 ?4.4 7 ?6 Lat leg - Ankle 14    R Median, Ulnar - Transcarpal comparison     Median Palm Wrist 2.1 ?2.2 102 ?35 Median Palm - Wrist 8       Ulnar Palm Wrist 2.2 ?2.2 37 ?12 Ulnar Palm - Wrist 8          Median Palm - Ulnar Palm  -0.1 ?0.4  L Median, Ulnar - Transcarpal comparison     Median Palm Wrist 2.1 ?2.2 100 ?35 Median Palm - Wrist 8       Ulnar Palm Wrist 2.1 ?2.2 44 ?12 Ulnar Palm - Wrist 8          Median Palm - Ulnar Palm  -0.0 ?0.4  R Median - Orthodromic (Dig II, Mid palm)     Dig II Wrist 3.2 ?3.4 30 ?10 Dig II - Wrist 13    L Median - Orthodromic (Dig II, Mid palm)     Dig II Wrist 3.3 ?3.4 29 ?10 Dig II - Wrist 13    R Ulnar - Orthodromic, (Dig V, Mid palm)     Dig V Wrist 2.9 ?3.1 17 ?5 Dig V - Wrist 11    L Ulnar - Orthodromic, (Dig V, Mid palm)     Dig V Wrist 3.0 ?3.1 15 ?5 Dig V - Wrist 60                       F  Wave    Nerve F Lat Ref.   ms ms  R Ulnar - ADM 25.5 ?32.0  L Ulnar - ADM 26.7 ?32.0  L Tibial - AH 45.9 ?56.0           EMG Summary Table    Spontaneous MUAP Recruitment  Muscle IA Fib PSW Fasc Other Amp Dur. Poly Pattern  L. Deltoid Normal None None None _______ Normal Normal Normal Normal  L. Triceps brachii Normal None None None  _______ Normal Normal Normal Normal  L. Pronator teres Normal None None None _______ Normal Normal Normal Normal  L. Opponens pollicis Normal None None None _______ Normal Normal Normal Normal  L. First dorsal interosseous Normal None None None _______ Normal Normal Normal Normal  L. Vastus medialis Normal None None None _______ Normal Normal Normal Normal  L. Tibialis anterior Normal None None None _______ Normal Normal Normal Normal  L. Gastrocnemius (Medial head) Normal None None None _______ Normal Normal Normal Normal  L. Extensor hallucis longus Normal None None None _______ Normal Normal Normal Normal  L. Abductor hallucis Normal None None None _______ Normal Normal Normal Normal

## 2020-05-17 NOTE — Progress Notes (Signed)
See procedure note.

## 2020-05-17 NOTE — Procedures (Signed)
      Full Name: Hayley Price Gender: Female MRN #: [id number] Date of Birth: 01/16/1966    Visit Date: 05/10/2020 13:27 Age: 54 Years Examining Physician: Taylore Hinde, MD  Referring Physician: Wharton, Courtney, PA-C Height: 5 feet 4 inch Patient History: 148lbs    History: Multiple neurologic symptoms after Covid infection including numbness/tingling in the legs and arms. MRI of the brain and cervical spine did not show any etiology for her symptoms (see report in Epic). Blood work normal (see Epic)  Summary: EMG/NCS of the bilateral upper extremities and left lower extremity. All nerves and muscles (as indicated in the following tables) were within normal limits.  Conclusion: This is a normal study. No evidence for mononeuropathy, large-fiber polyneuropathy, radiculopathy or muscle disorder.   Kimberl Vig, M.D.  [facility] [address], [address] [place],  [address] Tel: [phone] Fax: [phone]  Verbal informed consent was obtained from the patient, patient was informed of potential risk of procedure, including bruising, bleeding, hematoma formation, infection, muscle weakness, muscle pain, numbness, among others.        MNC    Nerve / Sites Muscle Latency Ref. Amplitude Ref. Rel Amp Segments Distance Velocity Ref. Area    ms ms mV mV %  cm m/s m/s mVms  R Median - APB     Wrist APB 3.7 ?4.4 8.0 ?4.0 100 Wrist - APB 7   35.9     Upper arm APB 7.2  7.5  94 Upper arm - Wrist 20 58 ?49 34.0  L Median - APB     Wrist APB 3.6 ?4.4 8.1 ?4.0 100 Wrist - APB 7   36.0     Upper arm APB 7.1  7.4  90.9 Upper arm - Wrist 20 57 ?49 33.9  R Ulnar - ADM     Wrist ADM 3.3 ?3.3 11.3 ?6.0 100 Wrist - ADM 7   41.9     B.Elbow ADM 6.1  10.2  89.6 B.Elbow - Wrist 19 68 ?49 40.5     A.Elbow ADM 7.6  9.9  97.7 A.Elbow - B.Elbow 10 64 ?49 39.7  L Ulnar - ADM     Wrist ADM 3.3 ?3.3 10.5 ?6.0 100 Wrist - ADM 7   38.2     B.Elbow ADM 6.2  9.8  93.7 B.Elbow  - Wrist 18 61 ?49 36.3     A.Elbow ADM 7.9  9.5  97.2 A.Elbow - B.Elbow 10 61 ?49 36.4  L Peroneal - EDB     Ankle EDB 4.0 ?6.5 4.4 ?2.0 100 Ankle - EDB 9   21.4     Fib head EDB 9.7  4.2  94.5 Fib head - Ankle 28 49 ?44 16.5     Pop fossa EDB 11.8  4.1  98.7 Pop fossa - Fib head 10 48 ?44 16.4         Pop fossa - Ankle      L Tibial - AH     Ankle AH 4.4 ?5.8 9.7 ?4.0 100 Ankle - AH 9   33.4     Pop fossa AH 12.2  8.6  88.6 Pop fossa - Ankle 33 42 ?41 31.6                  SNC    Nerve / Sites Rec. Site Peak Lat Ref.  Amp Ref. Segments Distance Peak Diff Ref.    ms ms V V  cm ms ms  L   Sural - Ankle (Calf)     Calf Ankle 3.8 ?4.4 13 ?6 Calf - Ankle 14    L Superficial peroneal - Ankle     Lat leg Ankle 3.8 ?4.4 7 ?6 Lat leg - Ankle 14    R Median, Ulnar - Transcarpal comparison     Median Palm Wrist 2.1 ?2.2 102 ?35 Median Palm - Wrist 8       Ulnar Palm Wrist 2.2 ?2.2 37 ?12 Ulnar Palm - Wrist 8          Median Palm - Ulnar Palm  -0.1 ?0.4  L Median, Ulnar - Transcarpal comparison     Median Palm Wrist 2.1 ?2.2 100 ?35 Median Palm - Wrist 8       Ulnar Palm Wrist 2.1 ?2.2 44 ?12 Ulnar Palm - Wrist 8          Median Palm - Ulnar Palm  -0.0 ?0.4  R Median - Orthodromic (Dig II, Mid palm)     Dig II Wrist 3.2 ?3.4 30 ?10 Dig II - Wrist 13    L Median - Orthodromic (Dig II, Mid palm)     Dig II Wrist 3.3 ?3.4 29 ?10 Dig II - Wrist 13    R Ulnar - Orthodromic, (Dig V, Mid palm)     Dig V Wrist 2.9 ?3.1 17 ?5 Dig V - Wrist 11    L Ulnar - Orthodromic, (Dig V, Mid palm)     Dig V Wrist 3.0 ?3.1 15 ?5 Dig V - Wrist 60                       F  Wave    Nerve F Lat Ref.   ms ms  R Ulnar - ADM 25.5 ?32.0  L Ulnar - ADM 26.7 ?32.0  L Tibial - AH 45.9 ?56.0           EMG Summary Table    Spontaneous MUAP Recruitment  Muscle IA Fib PSW Fasc Other Amp Dur. Poly Pattern  L. Deltoid Normal None None None _______ Normal Normal Normal Normal  L. Triceps brachii Normal None None None  _______ Normal Normal Normal Normal  L. Pronator teres Normal None None None _______ Normal Normal Normal Normal  L. Opponens pollicis Normal None None None _______ Normal Normal Normal Normal  L. First dorsal interosseous Normal None None None _______ Normal Normal Normal Normal  L. Vastus medialis Normal None None None _______ Normal Normal Normal Normal  L. Tibialis anterior Normal None None None _______ Normal Normal Normal Normal  L. Gastrocnemius (Medial head) Normal None None None _______ Normal Normal Normal Normal  L. Extensor hallucis longus Normal None None None _______ Normal Normal Normal Normal  L. Abductor hallucis Normal None None None _______ Normal Normal Normal Normal

## 2021-03-03 ENCOUNTER — Other Ambulatory Visit: Payer: Self-pay | Admitting: Family Medicine

## 2021-03-03 DIAGNOSIS — Z1231 Encounter for screening mammogram for malignant neoplasm of breast: Secondary | ICD-10-CM

## 2021-04-05 ENCOUNTER — Ambulatory Visit
Admission: RE | Admit: 2021-04-05 | Discharge: 2021-04-05 | Disposition: A | Discharge status: Home / Self Care | Payer: 59 | Source: Ambulatory Visit | Attending: Family Medicine | Admitting: Family Medicine

## 2021-04-05 ENCOUNTER — Other Ambulatory Visit: Payer: Self-pay

## 2021-04-05 DIAGNOSIS — Z1231 Encounter for screening mammogram for malignant neoplasm of breast: Secondary | ICD-10-CM

## 2022-03-22 ENCOUNTER — Other Ambulatory Visit: Payer: Self-pay | Admitting: Family Medicine

## 2022-03-22 DIAGNOSIS — E559 Vitamin D deficiency, unspecified: Secondary | ICD-10-CM

## 2022-04-06 ENCOUNTER — Ambulatory Visit
Admission: RE | Admit: 2022-04-06 | Discharge: 2022-04-06 | Disposition: A | Discharge status: Home / Self Care | Payer: 59 | Source: Ambulatory Visit | Attending: Family Medicine | Admitting: Family Medicine

## 2022-04-06 DIAGNOSIS — E559 Vitamin D deficiency, unspecified: Secondary | ICD-10-CM

## 2023-03-06 IMAGING — MG MM DIGITAL SCREENING BILAT W/ TOMO AND CAD
8 series · 9 of 24 positions shown · non-contrast
Comparison: Previous exam(s).

CLINICAL DATA: Screening.

EXAM:
DIGITAL SCREENING BILATERAL MAMMOGRAM WITH TOMOSYNTHESIS AND CAD
TECHNIQUE: Bilateral screening digital craniocaudal and mediolateral oblique
mammograms were obtained. Bilateral screening digital breast
tomosynthesis was performed. The images were evaluated with
computer-aided detection.

[R CC synth-2D]
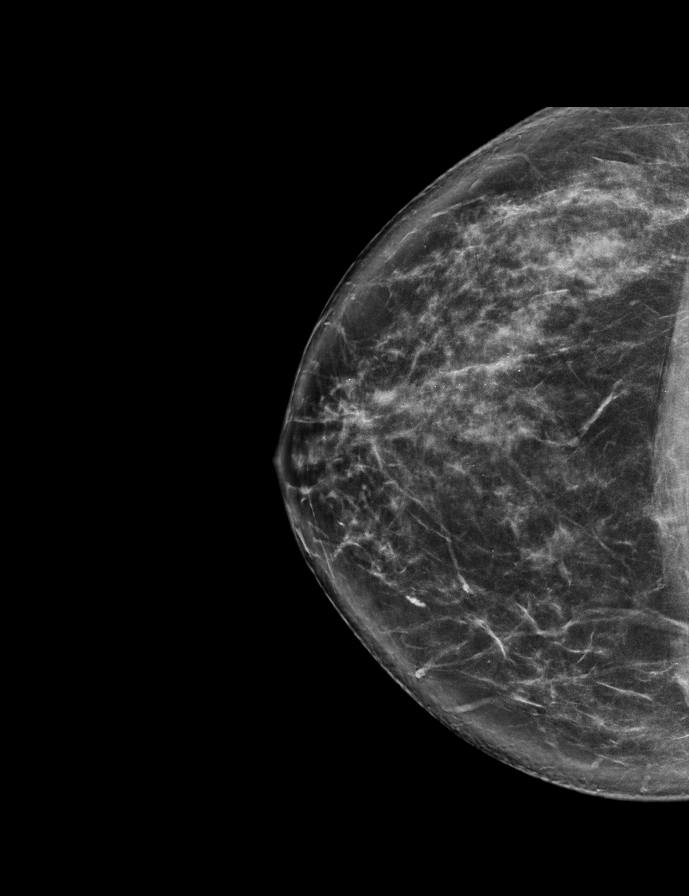

[L MLO synth-2D]
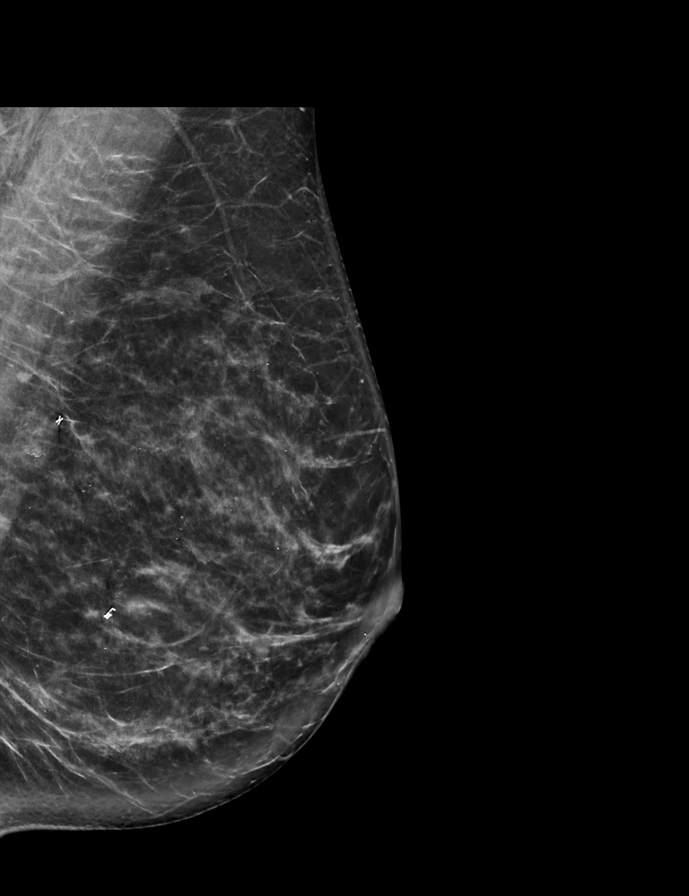

[R MLO synth-2D]
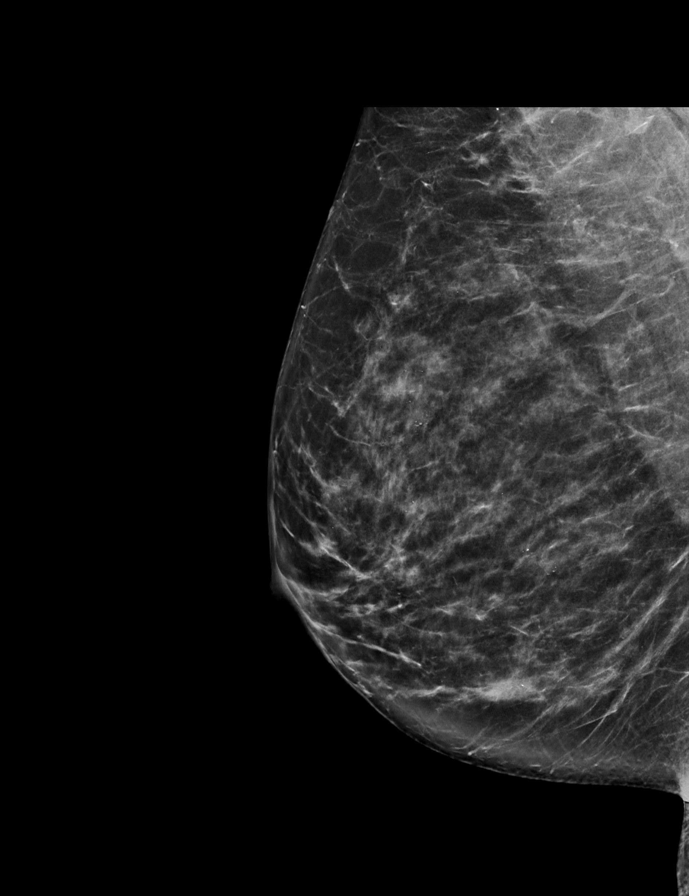

[L CC synth-2D]
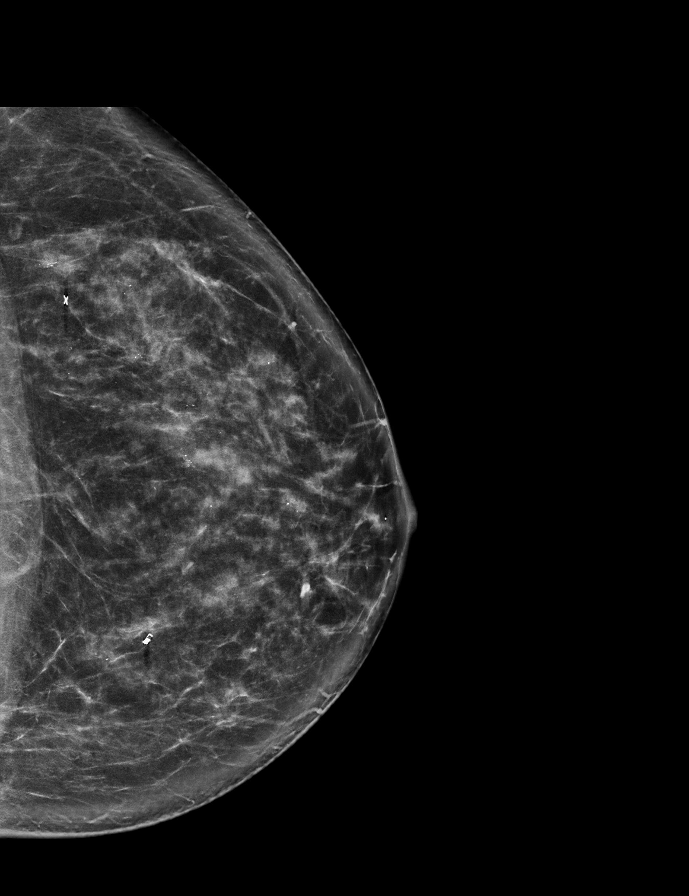

[L CC tomo · 2 of 75 frames shown]
[frame 25/75]
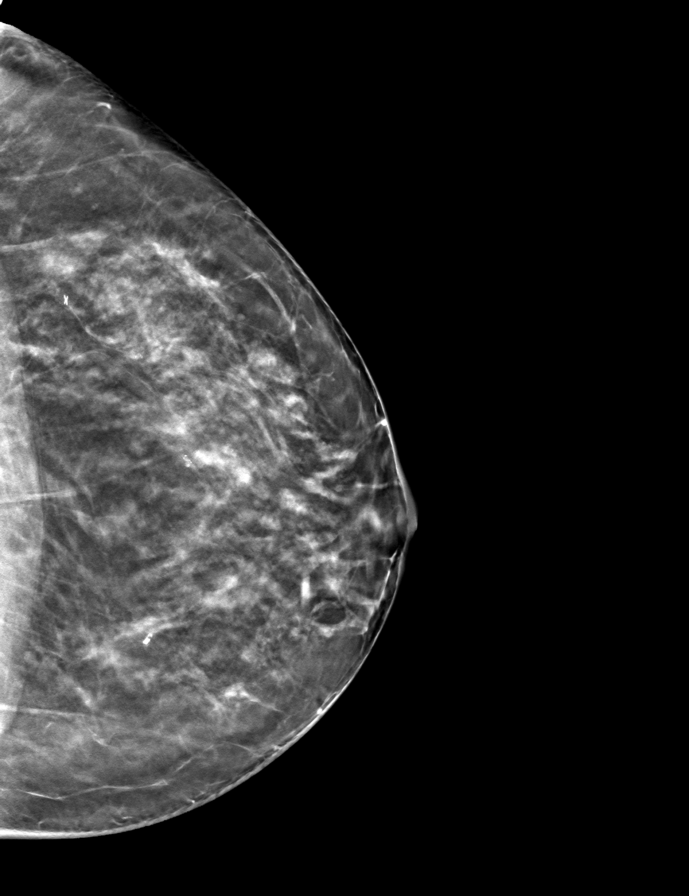
[frame 38/75]
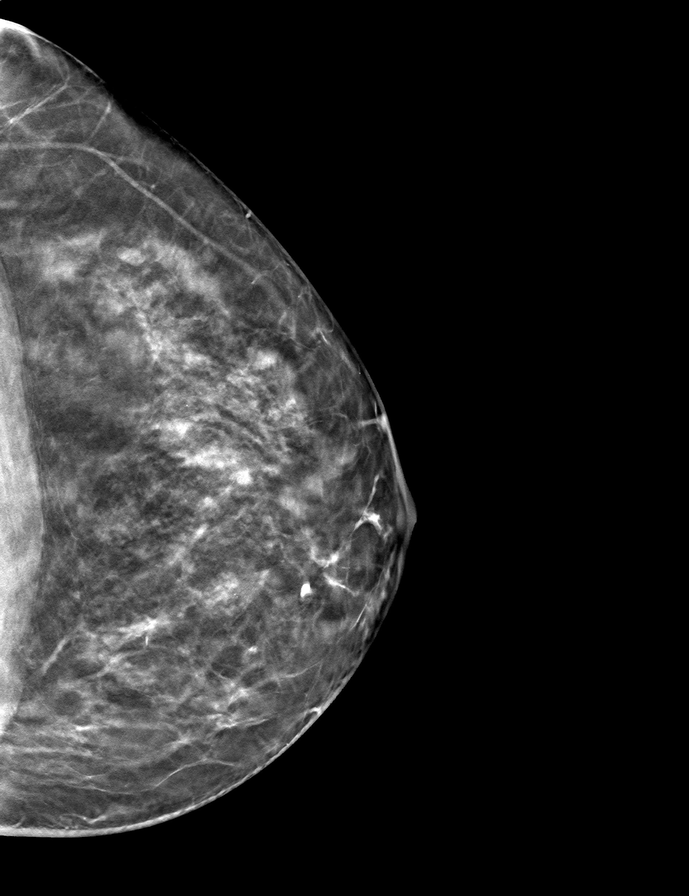

[L MLO tomo · tomo slice 39/77.0]
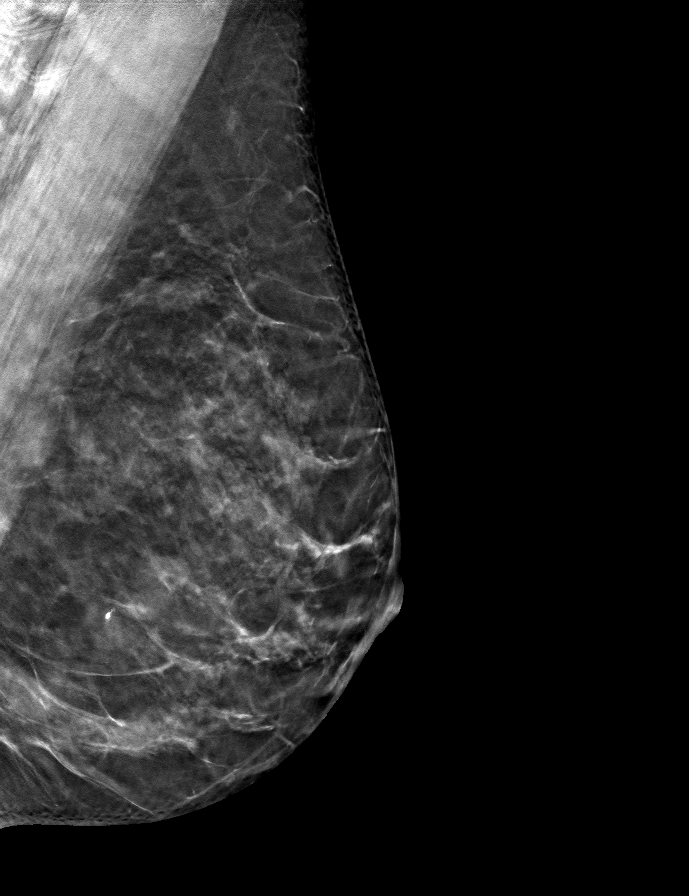

[R CC tomo · tomo slice 40/79.0]
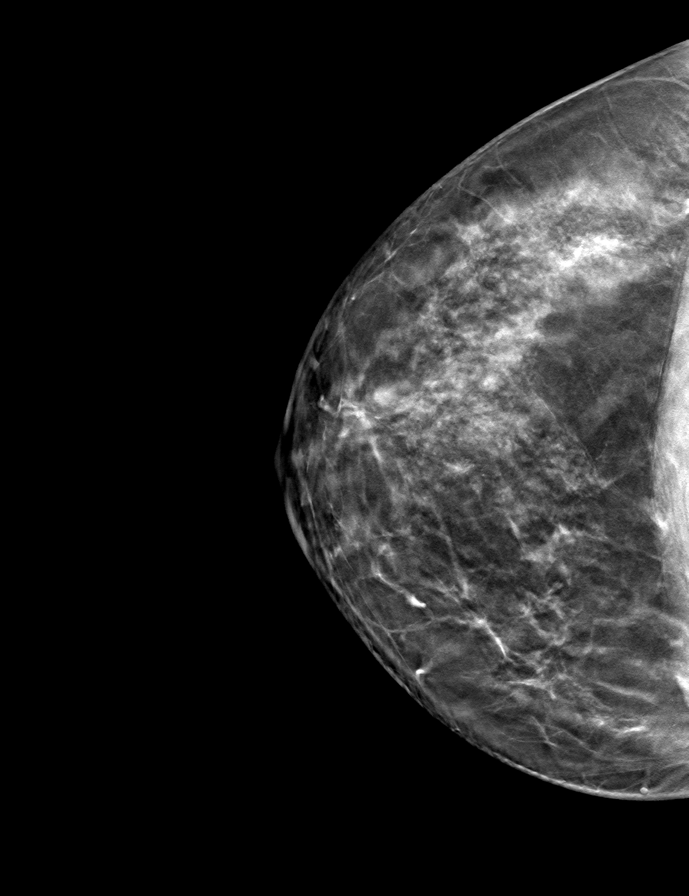

[R MLO tomo · tomo slice 38/75.0]
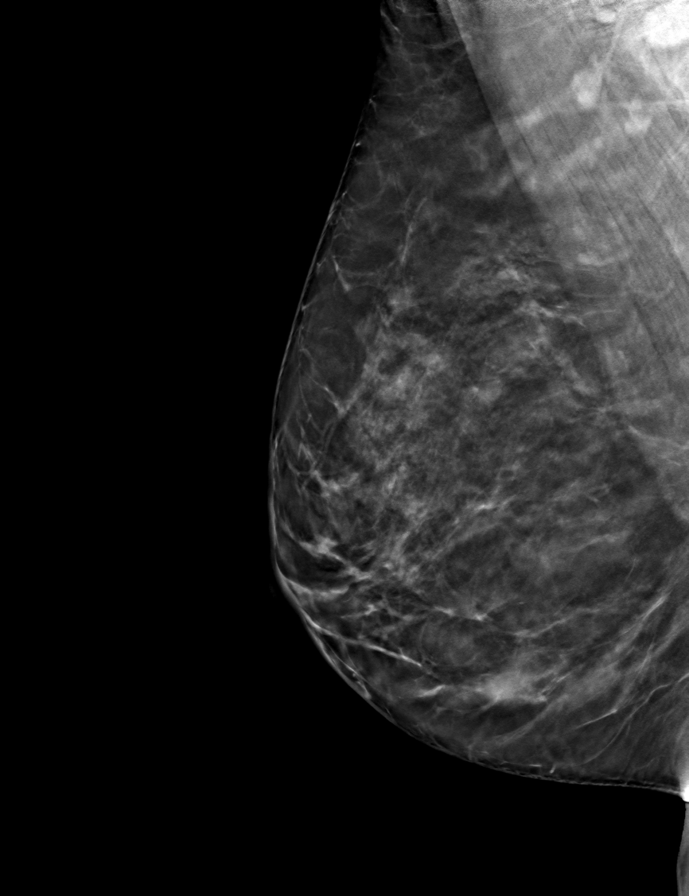

[9 of 24 positions shown; findings below may reference images not displayed]

ACR Breast Density Category c: The breast tissue is heterogeneously
dense, which may obscure small masses.
FINDINGS: There are no findings suspicious for malignancy.
IMPRESSION: No mammographic evidence of malignancy. A result letter of this
screening mammogram will be mailed directly to the patient.

RECOMMENDATION:
Screening mammogram in one year. (Code:Q3-W-BC3)

BI-RADS CATEGORY  1: Negative.

## 2023-05-14 ENCOUNTER — Other Ambulatory Visit: Payer: Self-pay | Admitting: Family Medicine

## 2023-05-14 DIAGNOSIS — Z1231 Encounter for screening mammogram for malignant neoplasm of breast: Secondary | ICD-10-CM

## 2023-06-13 ENCOUNTER — Ambulatory Visit
Admission: RE | Admit: 2023-06-13 | Discharge: 2023-06-13 | Disposition: A | Discharge status: Home / Self Care | Payer: Managed Care, Other (non HMO) | Source: Ambulatory Visit | Attending: Family Medicine | Admitting: Family Medicine

## 2023-06-13 DIAGNOSIS — Z1231 Encounter for screening mammogram for malignant neoplasm of breast: Secondary | ICD-10-CM

## 2023-10-26 ENCOUNTER — Other Ambulatory Visit: Payer: Self-pay

## 2023-10-26 ENCOUNTER — Ambulatory Visit (INDEPENDENT_AMBULATORY_CARE_PROVIDER_SITE_OTHER): Admitting: Internal Medicine

## 2023-10-26 ENCOUNTER — Encounter: Payer: Self-pay | Admitting: Internal Medicine

## 2023-10-26 VITALS — BP 128/72 | HR 80 | Temp 98.9°F | Ht 63.39 in | Wt 156.5 lb

## 2023-10-26 DIAGNOSIS — U099 Post covid-19 condition, unspecified: Secondary | ICD-10-CM | POA: Diagnosis not present

## 2023-10-26 NOTE — Patient Instructions (Addendum)
-   Use Zyrtec 10mg  daily or Xyzal 5mg  daily or Allegra 180mg  daily.   - Will check tryptase for any mast cell disease but low suspicion. - Will refer to [facility] for possible neuropathy/long COVID symptoms.

## 2023-10-26 NOTE — Progress Notes (Signed)
 NEW PATIENT  Date of Service/Encounter:  10/26/23  Consult requested by: Darnelle Elders, PA-C   Subjective:   Hayley Price (DOB: April 30, 1966) is a 58 y.o. female who presents to the clinic on 10/26/2023 with a chief complaint of Covid vaccine reaction and Establish Care .    History obtained from: chart review and patient.  Got sick in November prior to COVID onset with severe coughing, trouble breathing.   Worried about bronchitis/wheezing and pneumonia; thought it was COVID later.   Prior to that was healthy and never had issues.   1st COVID shot did fine, just some local pain. 2nd COVID shot, had sore arm and numbness of arms and legs within a few hours.  Within a week couldn't hold silverware, would fall, knees filled with fluid.  Also developed nerve pains and pins/needles, weight gain, and had a long menstrual cycle.  Got on hormonal therapy and it helped with symptoms a little too.    Went to Rheumatology and tried oral steroids and had improvement.  Some concern for MS initially also and saw Neurology.  Has undergone nerve studies, imaging, multiple myeloma studies which were all unremarkable.   Some improvement with Zyrtec and since stopping Zyrtec for this visit, had recurrence of symptoms.  Still having a lot of trouble with nerve pains, joint fluid, weight gain.     No rhinitis/asthma symptoms.    Large reactions to mosquito bites or fire ants or stinging insect.  No anaphylaxis.  Resolves with benadryl.       Reviewed:   04/20/2020: seen by Dr Tresia Fruit in Neurology  for joint aches, swelling; seen by Rheum with unremarkable workup.  Some improvement with steroids.    05/17/2020: seen for nerve testing; had a normal study, no evidence of mononeuropathy, radiculopathy, muscle disorders   MRI Spine/ Brain 04/2020: 1.   The spinal cord appears normal before and after contrast. 2.   Mild multilevel degenerative changes as detailed above that do not lead to  nerve root compression or spinal stenosis. 3.   Normal enhancement pattern.    4.   No acute findings.    1.   Several punctate T2/FLAIR hyperintense foci in the deep and subcortical white matter.  They do not enhance and do not appear to be acute.  These are nonspecific and most likely represent minimal chronic microvascular ischemic changes. 2.   There is a normal enhancement pattern.  No acute findings.  Past Medical History: Past Medical History:  Diagnosis Date   Cervical dysplasia    per notes from [facility]   Cystic acne    per notes from [facility]   HSV-2 seropositive    no confirmed active outbreaks; per notes from [facility]   Low vitamin D level    per notes from [facility]   Nephrolithiasis    per notes from [facility]   Tachycardia    per notes from [facility]    Past Surgical History: Past Surgical History:  Procedure Laterality Date   BREAST BIOPSY Left 2019   BREAST BIOPSY Left    per notes from [facility]   cervical conization  2000   per notes from [facility]; laser   COLONOSCOPY     per notes from [facility]; negative, rpt 10 years   TONSILLECTOMY     per notes from [facility]   vulvar reconstruction  1996   per notes from [facility]    Family History:  Family History  Problem Relation Age of Onset   Osteoporosis Mother    Pancreatic cancer Father    Healthy Brother    Osteoporosis Maternal Grandmother    Neuropathy Neg Hx     Medication List:  Allergies as of 10/26/2023       Reactions   Amitriptyline    photosensitivity   Bactrim [sulfamethoxazole-trimethoprim]    Skin sensitivity   Ciprofloxacin    Skin sensitivity, fever   Other    INSECT STINGS- large bite areas, swelling    Poison Ivy Extract    Break out at joints, voice lowers, needs steroids    Vioxx [rofecoxib]    Muscle spasms/seizures/SOB, word recall trouble, loss of coordination, "couldn't do math" in her  head.   Latex Rash        Medication List        Accurate as of Oct 26, 2023  1:18 PM. If you have any questions, ask your nurse or doctor.          amitriptyline 10 MG tablet Commonly known as: ELAVIL Take 10 mg by mouth at bedtime.   ascorbic acid 500 MG tablet Commonly known as: VITAMIN C Take 1,000 mg by mouth daily.   CALCIUM PO Take by mouth.   estradiol 1 MG tablet Commonly known as: ESTRACE Take 1 mg by mouth at bedtime.   ibuprofen 200 MG tablet Commonly known as: ADVIL Take 800 mg by mouth in the morning, at noon, and at bedtime.   MAGNESIUM CITRATE PO Take by mouth.   MULTIVITAMIN PO Take by mouth.   progesterone 200 MG capsule Commonly known as: PROMETRIUM Take 200 mg by mouth at bedtime.   VITAMIN D PO Take by mouth.         REVIEW OF SYSTEMS: Pertinent positives and negatives discussed in HPI.   Objective:   Physical Exam: BP 128/72 (BP Location: Left Arm, Patient Position: Sitting, Cuff Size: Normal)   Pulse 80   Temp 98.9 F (37.2 C) (Temporal)   Ht 5' 3.39" (1.61 m)   Wt 156 lb 8 oz (71 kg)   SpO2 100%   BMI 27.39 kg/m  Body mass index is 27.39 kg/m. GEN: alert, well developed HEENT: clear conjunctiva, MMM  HEART: regular rate and rhythm, no murmur LUNGS: clear to auscultation bilaterally, no coughing, unlabored respiration ABDOMEN: soft, non distended  SKIN: no rashes or lesions, no joint swelling on exam   Assessment:   1. Post-COVID syndrome     Plan/Recommendations:  Possible Long COVID - Use Zyrtec 10mg  daily or Xyzal 5mg  daily or Allegra 180mg  daily.   - Will check tryptase for any mast cell disease but low suspicion. - Will refer to [facility] for evaluation of long COVID/vaccine related reactions.     Return if symptoms worsen or fail to improve.  Kristen Petri, MD [facility] 

## 2023-10-29 ENCOUNTER — Telehealth: Payer: Self-pay | Admitting: Internal Medicine

## 2023-10-29 LAB — TRYPTASE: Tryptase: 5.5 ug/L (ref 2.2–13.2)

## 2023-10-29 NOTE — Telephone Encounter (Signed)
 Hayley Price has been referred to:  [facility] [address] [facility] [place], [place] [address]  I have faxed the referral and all corresponding notes to their office.  They will reach out to the patient to schedule.  I will follow up in one week.

## 2023-11-15 NOTE — Telephone Encounter (Signed)
 I called [facility] to check up on Hayley Price's referral.  They reached out to [place] several times.  They stated they will reach out to her one more time and then close out the referral if she does not respond.  I will send Prakriti a message asking her to call.

## 2023-12-06 NOTE — Telephone Encounter (Signed)
 Hayley Price has been scheduled with [facility] for 02/21/2024 at 2:00 pm with Dr. Allean Aran.

## 2024-04-14 ENCOUNTER — Other Ambulatory Visit: Payer: Self-pay | Admitting: Family Medicine

## 2024-04-14 DIAGNOSIS — Z1231 Encounter for screening mammogram for malignant neoplasm of breast: Secondary | ICD-10-CM

## 2024-06-06 ENCOUNTER — Other Ambulatory Visit: Payer: Self-pay | Admitting: Medical Genetics

## 2024-06-13 ENCOUNTER — Inpatient Hospital Stay: Admission: RE | Admit: 2024-06-13 | Discharge: 2024-06-13 | Attending: Family Medicine | Admitting: Family Medicine

## 2024-06-13 DIAGNOSIS — Z1231 Encounter for screening mammogram for malignant neoplasm of breast: Secondary | ICD-10-CM
